# Patient Record
Sex: Female | Born: 1955 | Race: White | Hispanic: No | Marital: Married | Smoking: Never smoker
Health system: Southern US, Community
[De-identification: ages and names within clinical notes are randomized; demographics above are authoritative.]

---

## 2018-02-15 ENCOUNTER — Encounter (HOSPITAL_BASED_OUTPATIENT_CLINIC_OR_DEPARTMENT_OTHER): Payer: Self-pay | Admitting: Emergency Medicine

## 2018-02-15 ENCOUNTER — Emergency Department (HOSPITAL_BASED_OUTPATIENT_CLINIC_OR_DEPARTMENT_OTHER)
Admission: EM | Admit: 2018-02-15 | Discharge: 2018-02-15 | Disposition: A | Discharge status: Home / Self Care | Payer: BLUE CROSS/BLUE SHIELD | Attending: Emergency Medicine | Admitting: Emergency Medicine

## 2018-02-15 ENCOUNTER — Other Ambulatory Visit: Payer: Self-pay

## 2018-02-15 ENCOUNTER — Emergency Department (HOSPITAL_BASED_OUTPATIENT_CLINIC_OR_DEPARTMENT_OTHER): Payer: BLUE CROSS/BLUE SHIELD

## 2018-02-15 DIAGNOSIS — R319 Hematuria, unspecified: Secondary | ICD-10-CM | POA: Diagnosis not present

## 2018-02-15 DIAGNOSIS — R51 Headache: Secondary | ICD-10-CM | POA: Insufficient documentation

## 2018-02-15 DIAGNOSIS — R110 Nausea: Secondary | ICD-10-CM | POA: Insufficient documentation

## 2018-02-15 DIAGNOSIS — N200 Calculus of kidney: Secondary | ICD-10-CM | POA: Diagnosis not present

## 2018-02-15 DIAGNOSIS — R350 Frequency of micturition: Secondary | ICD-10-CM | POA: Insufficient documentation

## 2018-02-15 DIAGNOSIS — R109 Unspecified abdominal pain: Secondary | ICD-10-CM | POA: Diagnosis present

## 2018-02-15 LAB — CBC WITH DIFFERENTIAL/PLATELET
Basophils Absolute: 0 10*3/uL (ref 0.0–0.1)
Basophils Relative: 0 %
EOS ABS: 0.1 10*3/uL (ref 0.0–0.7)
EOS PCT: 1 %
HCT: 43.2 % (ref 36.0–46.0)
Hemoglobin: 14.7 g/dL (ref 12.0–15.0)
LYMPHS ABS: 2.6 10*3/uL (ref 0.7–4.0)
Lymphocytes Relative: 25 %
MCH: 30 pg (ref 26.0–34.0)
MCHC: 34 g/dL (ref 30.0–36.0)
MCV: 88.2 fL (ref 78.0–100.0)
MONO ABS: 0.6 10*3/uL (ref 0.1–1.0)
MONOS PCT: 6 %
Neutro Abs: 6.9 10*3/uL (ref 1.7–7.7)
Neutrophils Relative %: 68 %
PLATELETS: 289 10*3/uL (ref 150–400)
RBC: 4.9 MIL/uL (ref 3.87–5.11)
RDW: 13.3 % (ref 11.5–15.5)
WBC: 10.1 10*3/uL (ref 4.0–10.5)

## 2018-02-15 LAB — URINALYSIS, MICROSCOPIC (REFLEX)

## 2018-02-15 LAB — BASIC METABOLIC PANEL
Anion gap: 9 (ref 5–15)
BUN: 11 mg/dL (ref 6–20)
CO2: 27 mmol/L (ref 22–32)
CREATININE: 0.76 mg/dL (ref 0.44–1.00)
Calcium: 9.9 mg/dL (ref 8.9–10.3)
Chloride: 104 mmol/L (ref 101–111)
GFR calc Af Amer: >60 mL/min (ref >60)
Glucose, Bld: 94 mg/dL (ref 65–99)
Potassium: 3.7 mmol/L (ref 3.5–5.1)
Sodium: 140 mmol/L (ref 135–145)

## 2018-02-15 LAB — URINALYSIS, ROUTINE W REFLEX MICROSCOPIC
BILIRUBIN URINE: NEGATIVE
Glucose, UA: NEGATIVE mg/dL
KETONES UR: NEGATIVE mg/dL
NITRITE: NEGATIVE
Protein, ur: 30 mg/dL — AB
SPECIFIC GRAVITY, URINE: 1.02 (ref 1.005–1.030)
pH: 7 (ref 5.0–8.0)

## 2018-02-15 MED ORDER — ONDANSETRON 4 MG PO TBDP: 4.0000 mg | ORAL_TABLET | Freq: Three times a day (TID) | ORAL | 0 refills | Status: AC | PRN

## 2018-02-15 MED ORDER — KETOROLAC TROMETHAMINE 15 MG/ML IJ SOLN
15.0000 mg | Freq: Once | INTRAMUSCULAR | Status: AC
  Administered 2018-02-15: 15 mg via INTRAVENOUS
  Filled 2018-02-15: qty 1

## 2018-02-15 MED ORDER — TAMSULOSIN HCL 0.4 MG PO CAPS: 0.4000 mg | ORAL_CAPSULE | Freq: Every day | ORAL | 0 refills | Status: AC

## 2018-02-15 MED ORDER — SODIUM CHLORIDE 0.9 % IV BOLUS
500.0000 mL | Freq: Once | INTRAVENOUS | Status: AC
  Administered 2018-02-15: 500 mL via INTRAVENOUS

## 2018-02-15 MED ORDER — OXYCODONE-ACETAMINOPHEN 5-325 MG PO TABS: 1.0000 | ORAL_TABLET | Freq: Four times a day (QID) | ORAL | 0 refills | Status: AC | PRN

## 2018-02-15 MED ORDER — IBUPROFEN 800 MG PO TABS: 800.0000 mg | ORAL_TABLET | Freq: Three times a day (TID) | ORAL | 0 refills | Status: AC | PRN

## 2018-02-15 NOTE — ED Triage Notes (Signed)
patient states that she woke up this am and started to have blood in her urine  - the patient also reports that she had a headache. The patient states that throughout the day she started to have mid back pain - the patient states that about an hour ago she went to the restroom and had some blood clots in her urine  - patient states that after this she started to have right flank pain

## 2018-02-15 NOTE — ED Provider Notes (Signed)
[facility] EMERGENCY DEPARTMENT Provider Note   CSN: [id number] Arrival date & time: 02/15/18  1654     History   Chief Complaint Chief Complaint  Patient presents with  . Flank Pain    HPI Michelle Terry is a 62 y.o. female without significant past medical hx who presents to the ED for hematuria which started today. Patient states she woke up at 04:30 this AM to use the restroom and when she urinated she noted urine was fairly bright red and appeared to have blood in it. She states she has had urinary frequency throughout the day and intermittent episodes of hematuria, she has however had some urine without blood. She states at one point there looked to be a few very small clots in the toilet bowl. She states that she started have very mild pain to just the right of midline to mid back this AM, and then at 16:30 this evening she had an episode of fairly severe R flank pain which in combination with hematuria prompted ER visit. Severe pain lasted approximately 20-30 minutes with associated nausea without vomiting. Pain has since eased off and is now a dull pain that is mild in severity. No specific alleviating/aggravating factors. No meds PTA. She states she has had a UTI before and this does not feel similar at all, no hx of similar. Denies fever, chills, dysuria, vaginal bleeding/discharge, vomiting, diarrhea, or blood in stool.   Patient also mentions that she has had intermittent mild headache throughout the day, steady progression, gradual onset, similar to previous, she states this may be due to being concerned about her other sxs. Denies change in vision, numbness, weakness, or dizziness.   HPI  History reviewed. No pertinent past medical history.  There are no active problems to display for this patient.   History reviewed. No pertinent surgical history.   OB History   None      Home Medications    Prior to Admission medications   Not on File    Family  History History reviewed. No pertinent family history.  Social History Social History   Tobacco Use  . Smoking status: Never Smoker  . Smokeless tobacco: Never Used  Substance Use Topics  . Alcohol use: Yes    Comment: rare  . Drug use: Never     Allergies   Codeine   Review of Systems Review of Systems  Constitutional: Negative for chills and fever.  Eyes: Negative for visual disturbance.  Respiratory: Negative for shortness of breath.   Cardiovascular: Negative for chest pain.  Genitourinary: Positive for flank pain (R), frequency and hematuria. Negative for dysuria, vaginal bleeding and vaginal discharge.  Neurological: Positive for headaches. Negative for dizziness, seizures, weakness and numbness.  All other systems reviewed and are negative.    Physical Exam Updated Vital Signs BP (!) 164/70 (BP Location: Right Arm)   Pulse 68   Temp 98.1 F (36.7 C) (Oral)   Resp 16   Ht 5\' 5"  (1.651 m)   Wt 68 kg (150 lb)   SpO2 100%   BMI 24.96 kg/m   Physical Exam  Constitutional: She appears well-developed and well-nourished. No distress.  HENT:  Head: Normocephalic and atraumatic.  Eyes: Pupils are equal, round, and reactive to light. Conjunctivae and EOM are normal. Right eye exhibits no discharge. Left eye exhibits no discharge.  Neck: Normal range of motion. Neck supple.  No nuchal rigidity  Cardiovascular: Normal rate and regular rhythm.  No murmur heard. Pulmonary/Chest:  Effort normal and breath sounds normal. No respiratory distress. She has no wheezes. She has no rales.  Abdominal: Soft. She exhibits no distension. There is no tenderness. There is CVA tenderness (mild right). There is no rigidity, no rebound, no guarding, no tenderness at McBurney's point and negative Murphy's sign.  Neurological: She is alert.  Clear speech. CN III-XII grossly intact. Sensation grossly intact x 4. 5/5 symmetric grip strength and strength with plantar/dorsiflexion  bilaterally. Steady gait.   Skin: Skin is warm and dry. No rash noted.  Psychiatric: She has a normal mood and affect. Her behavior is normal.  Nursing note and vitals reviewed.   ED Treatments / Results  Labs Results for orders placed or performed during the hospital encounter of 02/15/18  Urinalysis, Routine w reflex microscopic- may I&O cath if menses  Result Value Ref Range   Color, Urine RED (A) YELLOW   APPearance TURBID (A) CLEAR   Specific Gravity, Urine 1.020 1.005 - 1.030   pH 7.0 5.0 - 8.0   Glucose, UA NEGATIVE NEGATIVE mg/dL   Hgb urine dipstick LARGE (A) NEGATIVE   Bilirubin Urine NEGATIVE NEGATIVE   Ketones, ur NEGATIVE NEGATIVE mg/dL   Protein, ur 30 (A) NEGATIVE mg/dL   Nitrite NEGATIVE NEGATIVE   Leukocytes, UA SMALL (A) NEGATIVE  Urinalysis, Microscopic (reflex)  Result Value Ref Range   RBC / HPF >50 0 - 5 RBC/hpf   WBC, UA 0-5 0 - 5 WBC/hpf   Bacteria, UA FEW (A) NONE SEEN   Squamous Epithelial / LPF 0-5 0 - 5  CBC with Differential  Result Value Ref Range   WBC 10.1 4.0 - 10.5 K/uL   RBC 4.90 3.87 - 5.11 MIL/uL   Hemoglobin 14.7 12.0 - 15.0 g/dL   HCT 16.1 09.6 - 04.5 %   MCV 88.2 78.0 - 100.0 fL   MCH 30.0 26.0 - 34.0 pg   MCHC 34.0 30.0 - 36.0 g/dL   RDW 40.9 81.1 - 91.4 %   Platelets 289 150 - 400 K/uL   Neutrophils Relative % 68 %   Neutro Abs 6.9 1.7 - 7.7 K/uL   Lymphocytes Relative 25 %   Lymphs Abs 2.6 0.7 - 4.0 K/uL   Monocytes Relative 6 %   Monocytes Absolute 0.6 0.1 - 1.0 K/uL   Eosinophils Relative 1 %   Eosinophils Absolute 0.1 0.0 - 0.7 K/uL   Basophils Relative 0 %   Basophils Absolute 0.0 0.0 - 0.1 K/uL  Basic metabolic panel  Result Value Ref Range   Sodium 140 135 - 145 mmol/L   Potassium 3.7 3.5 - 5.1 mmol/L   Chloride 104 101 - 111 mmol/L   CO2 27 22 - 32 mmol/L   Glucose, Bld 94 65 - 99 mg/dL   BUN 11 6 - 20 mg/dL   Creatinine, Ser 7.82 0.44 - 1.00 mg/dL   Calcium 9.9 8.9 - 95.6 mg/dL   GFR calc non Af Amer >60 >60  mL/min   GFR calc Af Amer >60 >60 mL/min   Anion gap 9 5 - 15   EKG None  Radiology Ct Renal Stone Study  Result Date: 02/15/2018 CLINICAL DATA:  Hematuria and mid back pain. EXAM: CT ABDOMEN AND PELVIS WITHOUT CONTRAST TECHNIQUE: Multidetector CT imaging of the abdomen and pelvis was performed following the standard protocol without IV contrast. COMPARISON:  None. FINDINGS: Lower chest: No acute abnormality. Hepatobiliary: No focal liver abnormality is seen. No gallstones, gallbladder wall thickening, or biliary dilatation.  Pancreas: Unremarkable. No pancreatic ductal dilatation or surrounding inflammatory changes. Spleen: Normal in size without focal abnormality. Adrenals/Urinary Tract: Normal bilateral adrenal glands. Mild-to-moderate right-sided hydroureteronephrosis secondary to a 4 x 4 x 2 mm calculus in the distal right ureter just beyond the pelvic brim. Peripherally calcified complex cystic lesions of the left kidney are identified in the upper pole measuring 12 mm and 13 mm respectively with a more simple appearing interpolar cyst measuring 12 mm. Subtle noncalcified hypodensity also noted in the lower pole the left kidney measuring up to 8 mm likely reflects a simple cyst. A complex, partially calcified cystic lesion arising from the upper pole of the right kidney is suggested measuring 7 mm in diameter. A few faint 3 mm calcifications without obstruction are noted in the lower pole of the right kidney, best demonstrated on the coronal reformats, series 5/46. Stomach/Bowel: Stomach is within normal limits. Appendix appears normal. No evidence of bowel wall thickening, distention, or inflammatory changes. Vascular/Lymphatic: Mild atherosclerosis of the nonaneurysmal thoracic aorta. No lymphadenopathy by CT size criteria. Reproductive: Uterus and bilateral adnexa are unremarkable. Other: No free air nor free fluid. Musculoskeletal: No aggressive osseous lesions. IMPRESSION: 1. A 4 x 4 x 2 mm  distal right ureteral calculus is noted just beyond the pelvic brim contributing to mild-to-moderate right-sided hydroureteronephrosis. A few nonobstructing calcifications are noted in the lower pole the right kidney as well. These measure approximately 3 mm each. 2. Simple and complex cystic lesions of the kidneys some which are peripherally calcified as above described. These can be further correlated with nonemergent CT or MRI with contrast. Electronically Signed   By: Tollie Ethavid  Kwon M.D.   On: 02/15/2018 18:55    Procedures Procedures (including critical care time)  Medications Ordered in ED Medications  sodium chloride 0.9 % bolus 500 mL (500 mLs Intravenous New Bag/Given 02/15/18 2023)  ketorolac (TORADOL) 15 MG/ML injection 15 mg (15 mg Intravenous Given 02/15/18 2020)     Initial Impression / Assessment and Plan / ED Course  I have reviewed the triage vital signs and the nursing notes.  Pertinent labs & imaging results that were available during my care of the patient were reviewed by me and considered in my medical decision making (see chart for details).   Patient presents with hematuria and R flank pain. Patient nontoxic appearing, in no apparent distress, vitals notable for elevated BP otherwise WNL, doubt HTN emergency, improved with repeat vitals, patient aware of need for recheck. Patient with mild R CVA tenderness. DDX: nephrolithiasis, pyelonephritis/UTI, dissection,  appendicitis, muscle strain/spasm, cancerous process. Will evaluate with labs and CT renal study. Patient also mentions headaches today, not what prompted ER visit- has hx of similar headaches, gradual onset with steady progression in severity- non [facility], ICH, ischemic CVA, dural venous sinus thrombosis, acute glaucoma, giant cell arteritis, mass, or meningitis. Pt is afebrile with no focal neuro deficits, dizziness, change in vision, proptosis, or nuchal rigidity.    CT renal study notable for 4 x 4 x 2 mm  distal right ureteral calculus just beyond the pelvic brim contributing to mild-to-moderate right-sided hydroureteronephrosis. A few nonobstructing calcifications are noted in the lower pole the right kidney as well. These measure approximately 3 mm each. Additional findings of simple and complex cystic lesions of the kidneys some which are peripherally calcified as above described. These can be further correlated with nonemergent CT or MRI with contrast. Urinalysis consistent with hematuria, there is proteinuria present as well as few  bacteria- some squamous cell presents, patient without dysuria, doubt UTI/pyelonephritis., doubt infectious stone, will culture urine. No anemia or leukocytosis. BUN/Creatinine WNL. Toradol and fluids administered in the ER. Patient resting comfortably, tolerating PO. Will discharge home with Ibuprofen, percocet, flomax, and zofran with urology follow up. Patient has reported allergy to codeine on allergy list- discussed this with her, she states she had it in the hospital as a child and "screamed" and they told her she was allergic- no rash, dyspnea, throat closing sensation, or facial swelling that she recalls, additionally she has had pain medication after wisdom teeth extraction and abdominal surgery which she believed were narcotics, discussed that percocet has similar properties to codeine, given patient has likely had similar meds and reaction does not sound like true allergy will provide percocet prescription with strict return precaution information. Strainer provided. I discussed results (including need for further imaging for renal cystic lesions), treatment plan, need for urology follow-up, and return precautions with the patient and her husband at bedside. Provided opportunity for questions, patient and her husband confirmed understanding and are in agreement with plan.    Final Clinical Impressions(s) / ED Diagnoses   Final diagnoses:  Kidney stone    ED  Discharge Orders        Ordered    ibuprofen (ADVIL,MOTRIN) 800 MG tablet  Every 8 hours PRN     02/15/18 2116    tamsulosin (FLOMAX) 0.4 MG CAPS capsule  Daily after supper     02/15/18 2116    oxyCODONE-acetaminophen (PERCOCET/ROXICET) 5-325 MG tablet  Every 6 hours PRN     02/15/18 2116    ondansetron (ZOFRAN ODT) 4 MG disintegrating tablet  Every 8 hours PRN     02/15/18 2116       Cherly Anderson, PA-C 02/15/18 2126    Pricilla Loveless, MD 02/16/18 0006

## 2018-02-15 NOTE — Discharge Instructions (Signed)
You were seen in the emergency department and found to have a kidney stone on the right side.  According to your CT scan it looks like the kidney stone is near the right side of your pelvis.  We are sending you home with multiple medications: -Ibuprofen 800 mg-take this every 8 hours consistently for the next few days until the stone is passed.  This is a nonsteroidal anti-inflammatory medication that will help with pain and help to move the kidney stone.  Do not take other NSAIDs such as Motrin, Advil, Aleve, naproxen, or Mobic with this medicine at is it is similar. -Percocet-take this every 6 hours as needed for severe pain.  Do not drive or operate heavy machinery when you take this medicine.  Do not take this medicine when you drink alcohol.  Keep this out of the reach of small children.  As discussed we suspect you have taken this medication previously without problems, however given your potential reaction to codeine as a child return to the emergency department immediately should you start to experience any rash, facial swelling, feeling of throat closing, or trouble breathing. -Flomax-this is a medication you take daily to help pass the stone.  Take this daily with a meal.  -Zofran-this is an antinausea medication, take this every 8 hours as needed for nausea or vomiting.  We have prescribed you new medication(s) today. Discuss the medications prescribed today with your pharmacist as they can have adverse effects and interactions with your other medicines including over the counter and prescribed medications. Seek medical evaluation if you start to experience new or abnormal symptoms after taking one of these medicines, seek care immediately if you start to experience difficulty breathing, feeling of your throat closing, facial swelling, or rash as these could be indications of a more serious allergic reaction   Please follow-up with the urologist in your discharge instructions within 3 days.   Return to the ER for new or worsening symptoms including but not limited to worsening pain, inability to keep fluids down, fever, burning with urination, or any other concerns.  Additionally your blood pressure was elevated in the emergency department today please have this rechecked by her primary care provider.  Your CT scan did show some cystic lesions on your kidneys, the radiologist recommended that you have a CT or MRI with contrast to further evaluate this.  Please discuss with your primary care provider so they may order one of these tests in the future.

## 2018-02-15 NOTE — ED Notes (Signed)
Pt given d/c instructions as per chart. Rx x 4 with precautions. Verbalizes understanding. No questions. 

## 2018-02-17 LAB — URINE CULTURE: CULTURE: NO GROWTH

## 2020-01-06 IMAGING — CT CT RENAL STONE PROTOCOL
2 of 4 series · 16 of 46 positions shown, 18 images · non-contrast
Comparison: None.

CLINICAL DATA: Hematuria and mid back pain.

EXAM:
CT ABDOMEN AND PELVIS WITHOUT CONTRAST
TECHNIQUE: Multidetector CT imaging of the abdomen and pelvis was performed
following the standard protocol without IV contrast.

[Series 2: axial st · axial · 0.74mm/px · z∈[-469,-49]mm · 13 of 92 slices shown, 15 images]
[im 4/92  soft-tissue]
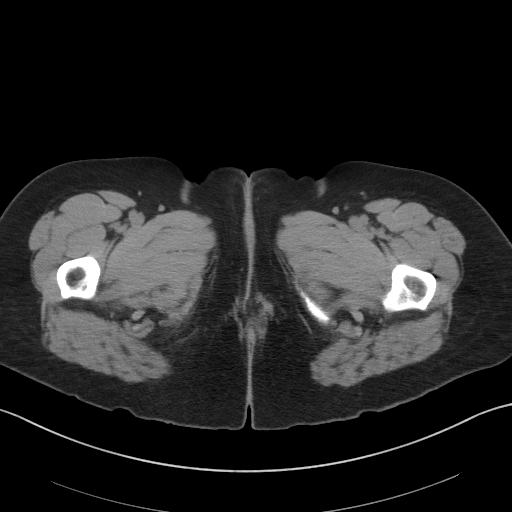
[im 4/92  bone]
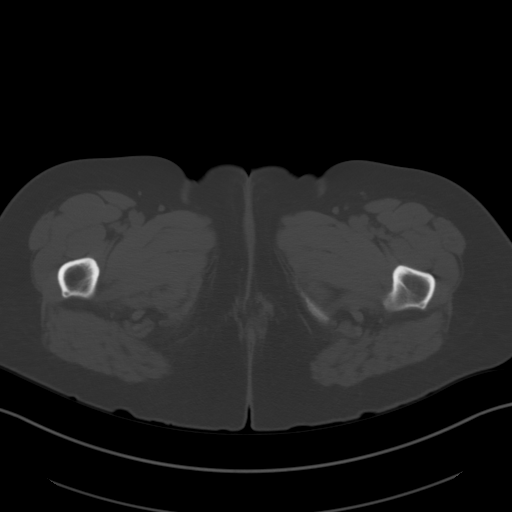
[im 12/92  soft-tissue]
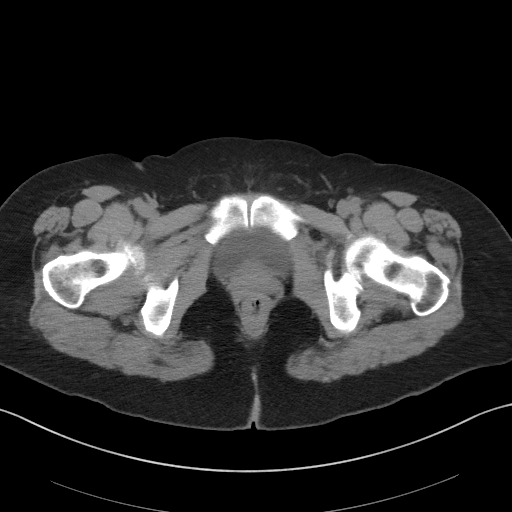
[im 19/92  soft-tissue]
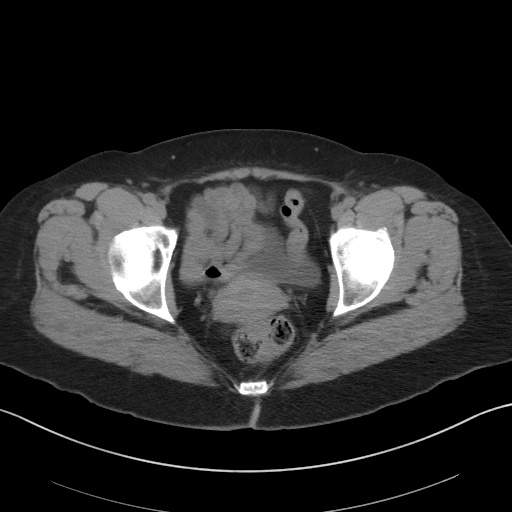
[im 27/92  soft-tissue]
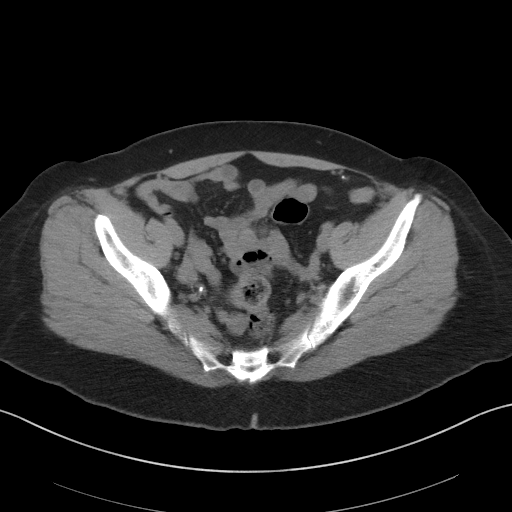
[im 31/92  soft-tissue]
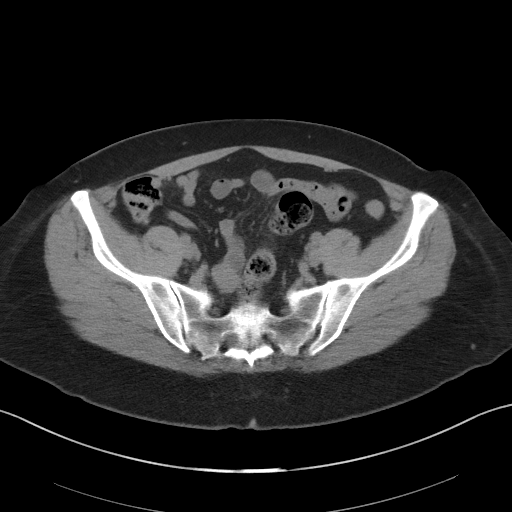
[im 38/92  soft-tissue]
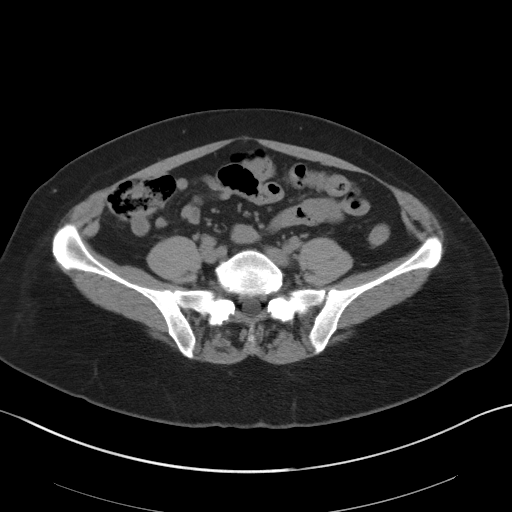
[im 46/92  soft-tissue]
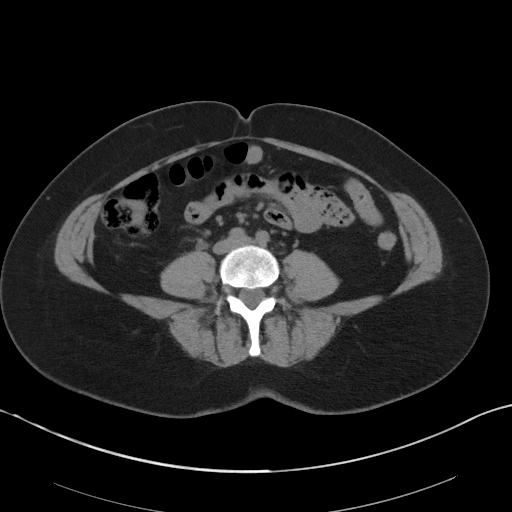
[im 54/92  soft-tissue]
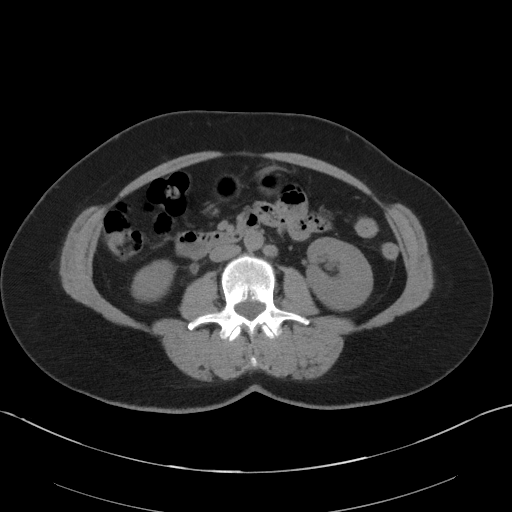
[im 61/92  soft-tissue]
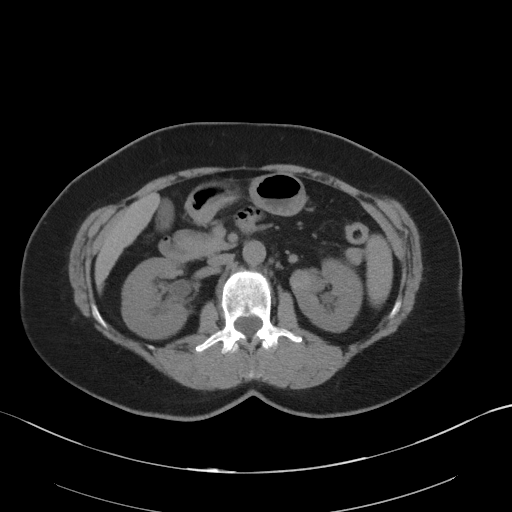
[im 61/92  bone]
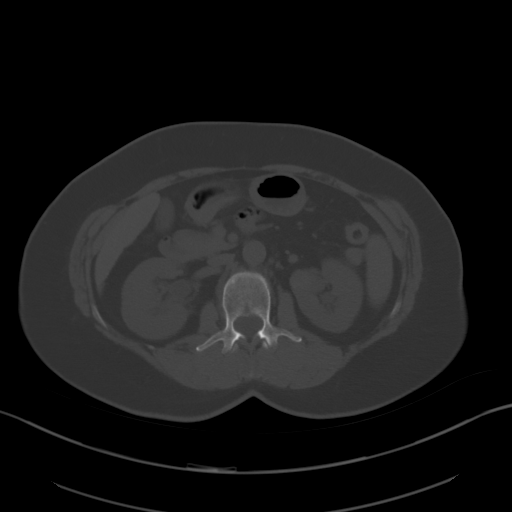
[im 65/92  soft-tissue]
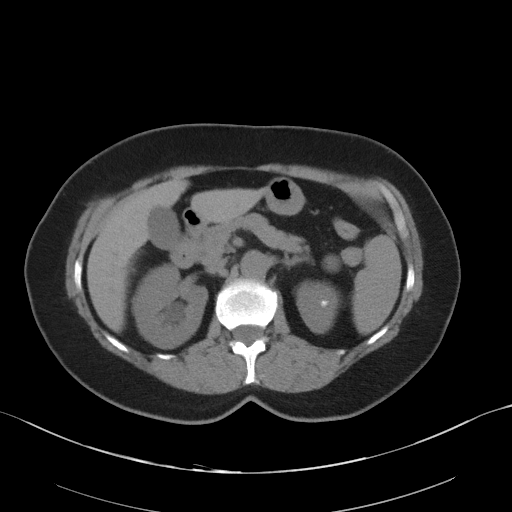
[im 73/92  soft-tissue]
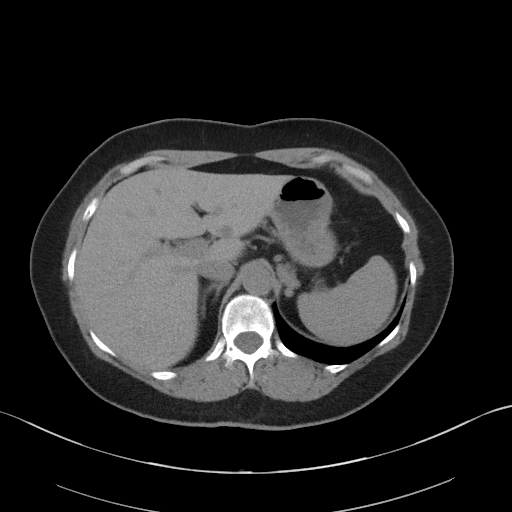
[im 80/92  soft-tissue]
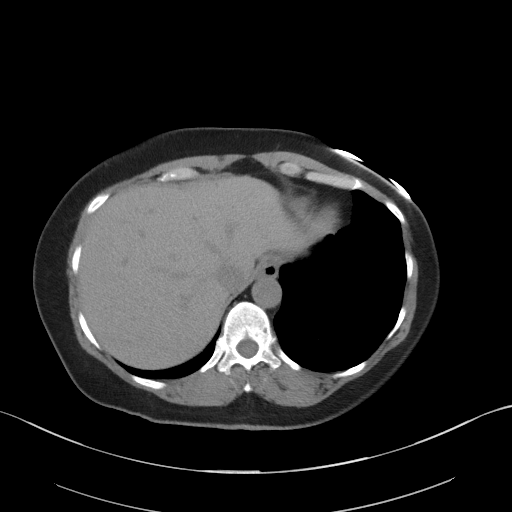
[im 88/92  soft-tissue]
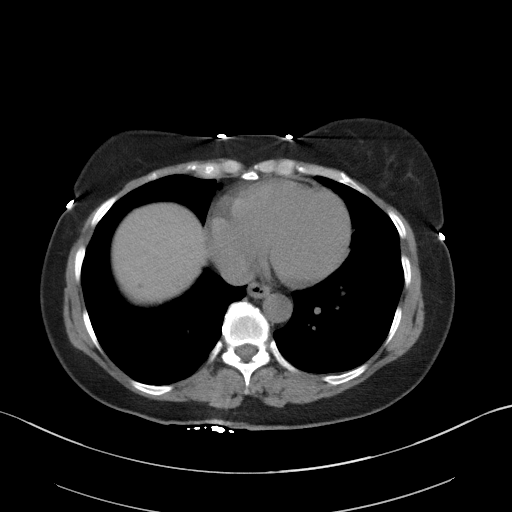

[Series 5: coronal st · coronal · 0.81mm/px · 3 of 70 slices shown]
[im 24/70  soft-tissue]
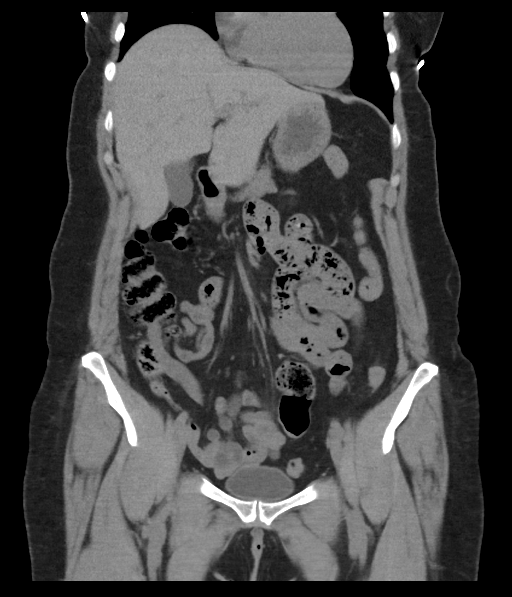
[im 31/70  soft-tissue]
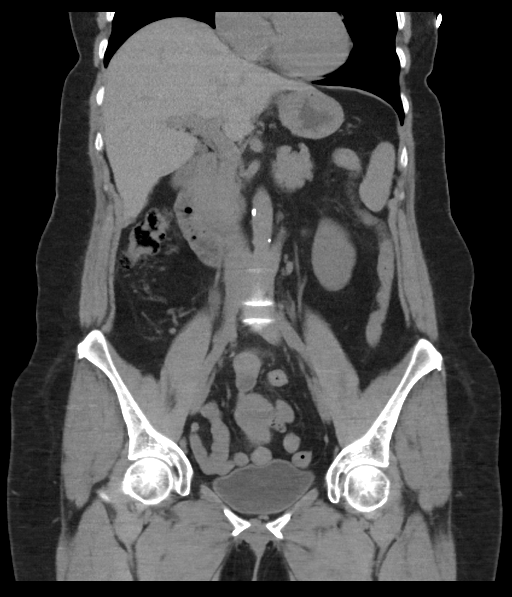
[im 39/70  soft-tissue]
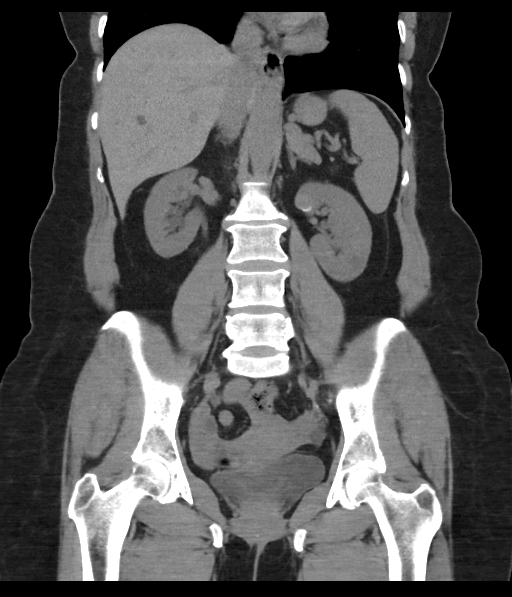

[16 of 46 positions shown; findings below may reference images not displayed]

FINDINGS: Lower chest: No acute abnormality.

Hepatobiliary: No focal liver abnormality is seen. No gallstones,
gallbladder wall thickening, or biliary dilatation.

Pancreas: Unremarkable. No pancreatic ductal dilatation or
surrounding inflammatory changes.

Spleen: Normal in size without focal abnormality.

Adrenals/Urinary Tract: Normal bilateral adrenal glands.
Mild-to-moderate right-sided hydroureteronephrosis secondary to a 4
x 4 x 2 mm calculus in the distal right ureter just beyond the
pelvic brim. Peripherally calcified complex cystic lesions of the
left kidney are identified in the upper pole measuring 12 mm and 13
mm respectively with a more simple appearing interpolar cyst
measuring 12 mm. Subtle noncalcified hypodensity also noted in the
lower pole the left kidney measuring up to 8 mm likely reflects a
simple cyst. A complex, partially calcified cystic lesion arising
from the upper pole of the right kidney is suggested measuring 7 mm
in diameter. A few faint 3 mm calcifications without obstruction are
noted in the lower pole of the right kidney, best demonstrated on
the coronal reformats, series [DATE].

Stomach/Bowel: Stomach is within normal limits. Appendix appears
normal. No evidence of bowel wall thickening, distention, or
inflammatory changes.

Vascular/Lymphatic: Mild atherosclerosis of the nonaneurysmal
thoracic aorta. No lymphadenopathy by CT size criteria.

Reproductive: Uterus and bilateral adnexa are unremarkable.

Other: No free air nor free fluid.

Musculoskeletal: No aggressive osseous lesions.
IMPRESSION: 1. A 4 x 4 x 2 mm distal right ureteral calculus is noted just
beyond the pelvic brim contributing to mild-to-moderate right-sided
hydroureteronephrosis. A few nonobstructing calcifications are noted
in the lower pole the right kidney as well. These measure
approximately 3 mm each.
2. Simple and complex cystic lesions of the kidneys some which are
peripherally calcified as above described. These can be further
correlated with nonemergent CT or MRI with contrast.

## 2021-06-29 DIAGNOSIS — J028 Acute pharyngitis due to other specified organisms: Secondary | ICD-10-CM | POA: Diagnosis not present

## 2021-06-29 DIAGNOSIS — J0110 Acute frontal sinusitis, unspecified: Secondary | ICD-10-CM | POA: Diagnosis not present

## 2021-07-05 DIAGNOSIS — L650 Telogen effluvium: Secondary | ICD-10-CM | POA: Diagnosis not present

## 2021-07-05 DIAGNOSIS — L658 Other specified nonscarring hair loss: Secondary | ICD-10-CM | POA: Diagnosis not present

## 2021-12-12 DIAGNOSIS — R82998 Other abnormal findings in urine: Secondary | ICD-10-CM | POA: Diagnosis not present

## 2021-12-12 DIAGNOSIS — J302 Other seasonal allergic rhinitis: Secondary | ICD-10-CM | POA: Diagnosis not present

## 2021-12-12 DIAGNOSIS — F4322 Adjustment disorder with anxiety: Secondary | ICD-10-CM | POA: Diagnosis not present

## 2021-12-12 DIAGNOSIS — I10 Essential (primary) hypertension: Secondary | ICD-10-CM | POA: Diagnosis not present

## 2021-12-28 DIAGNOSIS — M810 Age-related osteoporosis without current pathological fracture: Secondary | ICD-10-CM | POA: Diagnosis not present

## 2021-12-28 DIAGNOSIS — Z0000 Encounter for general adult medical examination without abnormal findings: Secondary | ICD-10-CM | POA: Diagnosis not present

## 2021-12-28 DIAGNOSIS — E785 Hyperlipidemia, unspecified: Secondary | ICD-10-CM | POA: Diagnosis not present

## 2021-12-31 DIAGNOSIS — Z0001 Encounter for general adult medical examination with abnormal findings: Secondary | ICD-10-CM | POA: Diagnosis not present

## 2021-12-31 DIAGNOSIS — E7800 Pure hypercholesterolemia, unspecified: Secondary | ICD-10-CM | POA: Diagnosis not present

## 2021-12-31 DIAGNOSIS — E785 Hyperlipidemia, unspecified: Secondary | ICD-10-CM | POA: Diagnosis not present

## 2021-12-31 DIAGNOSIS — R1032 Left lower quadrant pain: Secondary | ICD-10-CM | POA: Diagnosis not present

## 2021-12-31 DIAGNOSIS — R319 Hematuria, unspecified: Secondary | ICD-10-CM | POA: Diagnosis not present

## 2021-12-31 DIAGNOSIS — M810 Age-related osteoporosis without current pathological fracture: Secondary | ICD-10-CM | POA: Diagnosis not present

## 2021-12-31 DIAGNOSIS — F439 Reaction to severe stress, unspecified: Secondary | ICD-10-CM | POA: Diagnosis not present

## 2022-01-24 DIAGNOSIS — L650 Telogen effluvium: Secondary | ICD-10-CM | POA: Diagnosis not present

## 2022-01-24 DIAGNOSIS — L658 Other specified nonscarring hair loss: Secondary | ICD-10-CM | POA: Diagnosis not present

## 2022-01-25 DIAGNOSIS — N281 Cyst of kidney, acquired: Secondary | ICD-10-CM | POA: Diagnosis not present

## 2022-01-25 DIAGNOSIS — N200 Calculus of kidney: Secondary | ICD-10-CM | POA: Diagnosis not present

## 2022-01-25 DIAGNOSIS — R310 Gross hematuria: Secondary | ICD-10-CM | POA: Diagnosis not present

## 2022-02-04 DIAGNOSIS — N281 Cyst of kidney, acquired: Secondary | ICD-10-CM | POA: Diagnosis not present

## 2022-02-11 DIAGNOSIS — N200 Calculus of kidney: Secondary | ICD-10-CM | POA: Diagnosis not present

## 2022-02-11 DIAGNOSIS — N202 Calculus of kidney with calculus of ureter: Secondary | ICD-10-CM | POA: Diagnosis not present

## 2022-02-11 DIAGNOSIS — R310 Gross hematuria: Secondary | ICD-10-CM | POA: Diagnosis not present

## 2022-02-11 DIAGNOSIS — K7689 Other specified diseases of liver: Secondary | ICD-10-CM | POA: Diagnosis not present

## 2022-02-13 DIAGNOSIS — Z01810 Encounter for preprocedural cardiovascular examination: Secondary | ICD-10-CM | POA: Diagnosis not present

## 2022-02-14 DIAGNOSIS — N200 Calculus of kidney: Secondary | ICD-10-CM | POA: Diagnosis not present

## 2022-02-20 DIAGNOSIS — R310 Gross hematuria: Secondary | ICD-10-CM | POA: Diagnosis not present

## 2022-02-20 DIAGNOSIS — N200 Calculus of kidney: Secondary | ICD-10-CM | POA: Diagnosis not present

## 2022-04-04 DIAGNOSIS — R310 Gross hematuria: Secondary | ICD-10-CM | POA: Diagnosis not present

## 2022-04-04 DIAGNOSIS — N200 Calculus of kidney: Secondary | ICD-10-CM | POA: Diagnosis not present

## 2022-04-19 DIAGNOSIS — Z1231 Encounter for screening mammogram for malignant neoplasm of breast: Secondary | ICD-10-CM | POA: Diagnosis not present

## 2022-05-23 DIAGNOSIS — Z23 Encounter for immunization: Secondary | ICD-10-CM | POA: Diagnosis not present

## 2022-08-08 DIAGNOSIS — R319 Hematuria, unspecified: Secondary | ICD-10-CM | POA: Diagnosis not present

## 2022-08-08 DIAGNOSIS — R109 Unspecified abdominal pain: Secondary | ICD-10-CM | POA: Diagnosis not present

## 2022-08-08 DIAGNOSIS — N200 Calculus of kidney: Secondary | ICD-10-CM | POA: Diagnosis not present

## 2022-08-08 DIAGNOSIS — Z87442 Personal history of urinary calculi: Secondary | ICD-10-CM | POA: Diagnosis not present

## 2022-08-20 DIAGNOSIS — N200 Calculus of kidney: Secondary | ICD-10-CM | POA: Diagnosis not present

## 2022-09-30 DIAGNOSIS — N200 Calculus of kidney: Secondary | ICD-10-CM | POA: Diagnosis not present

## 2022-09-30 DIAGNOSIS — Z87442 Personal history of urinary calculi: Secondary | ICD-10-CM | POA: Diagnosis not present

## 2022-09-30 DIAGNOSIS — R319 Hematuria, unspecified: Secondary | ICD-10-CM | POA: Diagnosis not present

## 2022-09-30 DIAGNOSIS — R109 Unspecified abdominal pain: Secondary | ICD-10-CM | POA: Diagnosis not present

## 2022-09-30 DIAGNOSIS — I700 Atherosclerosis of aorta: Secondary | ICD-10-CM | POA: Diagnosis not present

## 2022-11-05 DIAGNOSIS — R110 Nausea: Secondary | ICD-10-CM | POA: Diagnosis not present

## 2022-11-05 DIAGNOSIS — R42 Dizziness and giddiness: Secondary | ICD-10-CM | POA: Diagnosis not present

## 2022-12-02 DIAGNOSIS — Z135 Encounter for screening for eye and ear disorders: Secondary | ICD-10-CM | POA: Diagnosis not present

## 2022-12-02 DIAGNOSIS — H524 Presbyopia: Secondary | ICD-10-CM | POA: Diagnosis not present

## 2023-01-13 DIAGNOSIS — W57XXXA Bitten or stung by nonvenomous insect and other nonvenomous arthropods, initial encounter: Secondary | ICD-10-CM | POA: Diagnosis not present

## 2023-01-13 DIAGNOSIS — A6920 Lyme disease, unspecified: Secondary | ICD-10-CM | POA: Diagnosis not present

## 2023-01-13 DIAGNOSIS — S40861A Insect bite (nonvenomous) of right upper arm, initial encounter: Secondary | ICD-10-CM | POA: Diagnosis not present

## 2023-02-26 DIAGNOSIS — W57XXXD Bitten or stung by nonvenomous insect and other nonvenomous arthropods, subsequent encounter: Secondary | ICD-10-CM | POA: Diagnosis not present

## 2023-02-26 DIAGNOSIS — J302 Other seasonal allergic rhinitis: Secondary | ICD-10-CM | POA: Diagnosis not present

## 2023-02-26 DIAGNOSIS — F4323 Adjustment disorder with mixed anxiety and depressed mood: Secondary | ICD-10-CM | POA: Diagnosis not present

## 2023-02-26 DIAGNOSIS — S50861D Insect bite (nonvenomous) of right forearm, subsequent encounter: Secondary | ICD-10-CM | POA: Diagnosis not present

## 2023-03-27 DIAGNOSIS — E782 Mixed hyperlipidemia: Secondary | ICD-10-CM | POA: Diagnosis not present

## 2023-03-27 DIAGNOSIS — M810 Age-related osteoporosis without current pathological fracture: Secondary | ICD-10-CM | POA: Diagnosis not present

## 2023-03-27 DIAGNOSIS — Z13220 Encounter for screening for lipoid disorders: Secondary | ICD-10-CM | POA: Diagnosis not present

## 2023-03-27 DIAGNOSIS — F4323 Adjustment disorder with mixed anxiety and depressed mood: Secondary | ICD-10-CM | POA: Diagnosis not present

## 2023-03-27 DIAGNOSIS — Z0000 Encounter for general adult medical examination without abnormal findings: Secondary | ICD-10-CM | POA: Diagnosis not present

## 2023-04-18 DIAGNOSIS — F4322 Adjustment disorder with anxiety: Secondary | ICD-10-CM | POA: Diagnosis not present

## 2023-04-18 DIAGNOSIS — E782 Mixed hyperlipidemia: Secondary | ICD-10-CM | POA: Diagnosis not present

## 2023-04-18 DIAGNOSIS — F4323 Adjustment disorder with mixed anxiety and depressed mood: Secondary | ICD-10-CM | POA: Diagnosis not present

## 2023-04-23 DIAGNOSIS — M8589 Other specified disorders of bone density and structure, multiple sites: Secondary | ICD-10-CM | POA: Diagnosis not present

## 2023-04-23 DIAGNOSIS — M810 Age-related osteoporosis without current pathological fracture: Secondary | ICD-10-CM | POA: Diagnosis not present

## 2023-04-23 DIAGNOSIS — Z780 Asymptomatic menopausal state: Secondary | ICD-10-CM | POA: Diagnosis not present

## 2023-04-23 DIAGNOSIS — Z1231 Encounter for screening mammogram for malignant neoplasm of breast: Secondary | ICD-10-CM | POA: Diagnosis not present

## 2023-05-26 DIAGNOSIS — F4323 Adjustment disorder with mixed anxiety and depressed mood: Secondary | ICD-10-CM | POA: Diagnosis not present

## 2023-05-26 DIAGNOSIS — G44209 Tension-type headache, unspecified, not intractable: Secondary | ICD-10-CM | POA: Diagnosis not present

## 2023-07-11 DIAGNOSIS — R490 Dysphonia: Secondary | ICD-10-CM | POA: Diagnosis not present

## 2023-07-11 DIAGNOSIS — J069 Acute upper respiratory infection, unspecified: Secondary | ICD-10-CM | POA: Diagnosis not present

## 2023-07-11 DIAGNOSIS — R051 Acute cough: Secondary | ICD-10-CM | POA: Diagnosis not present

## 2023-07-11 DIAGNOSIS — Z01818 Encounter for other preprocedural examination: Secondary | ICD-10-CM | POA: Diagnosis not present

## 2023-07-28 DIAGNOSIS — F4323 Adjustment disorder with mixed anxiety and depressed mood: Secondary | ICD-10-CM | POA: Diagnosis not present

## 2023-08-11 DIAGNOSIS — Z1211 Encounter for screening for malignant neoplasm of colon: Secondary | ICD-10-CM | POA: Diagnosis not present

## 2023-08-11 DIAGNOSIS — K648 Other hemorrhoids: Secondary | ICD-10-CM | POA: Diagnosis not present

## 2023-08-11 DIAGNOSIS — Z860100 Personal history of colon polyps, unspecified: Secondary | ICD-10-CM | POA: Diagnosis not present

## 2023-10-27 DIAGNOSIS — F4323 Adjustment disorder with mixed anxiety and depressed mood: Secondary | ICD-10-CM | POA: Diagnosis not present

## 2023-11-13 DIAGNOSIS — N200 Calculus of kidney: Secondary | ICD-10-CM | POA: Diagnosis not present

## 2023-11-24 DIAGNOSIS — F4323 Adjustment disorder with mixed anxiety and depressed mood: Secondary | ICD-10-CM | POA: Diagnosis not present

## 2023-12-04 DIAGNOSIS — H2513 Age-related nuclear cataract, bilateral: Secondary | ICD-10-CM | POA: Diagnosis not present

## 2023-12-04 DIAGNOSIS — H524 Presbyopia: Secondary | ICD-10-CM | POA: Diagnosis not present

## 2023-12-04 DIAGNOSIS — Z135 Encounter for screening for eye and ear disorders: Secondary | ICD-10-CM | POA: Diagnosis not present

## 2024-01-09 DIAGNOSIS — F4323 Adjustment disorder with mixed anxiety and depressed mood: Secondary | ICD-10-CM | POA: Diagnosis not present
